# Patient Record
Sex: Male | Born: 1985 | Race: White | Hispanic: Yes | Marital: Single | Smoking: Current every day smoker
Health system: Southern US, Community
[De-identification: ages and names within clinical notes are randomized; demographics above are authoritative.]

## PROBLEM LIST (undated history)

## (undated) ENCOUNTER — Ambulatory Visit: Admission: EM | Discharge status: Absent without leave | Payer: Self-pay

---

## 2009-08-13 ENCOUNTER — Emergency Department: Payer: Self-pay | Admitting: Emergency Medicine

## 2014-04-03 ENCOUNTER — Emergency Department: Payer: Self-pay | Admitting: Emergency Medicine

## 2014-04-03 LAB — URINALYSIS, COMPLETE
Bacteria: NONE SEEN
Bilirubin,UR: NEGATIVE
Blood: NEGATIVE
Glucose,UR: NEGATIVE mg/dL (ref 0–75)
Ketone: NEGATIVE
Leukocyte Esterase: NEGATIVE
Nitrite: NEGATIVE
Ph: 6 (ref 4.5–8.0)
Protein: NEGATIVE
RBC,UR: 1 /HPF (ref 0–5)
Specific Gravity: 1.03 (ref 1.003–1.030)
Squamous Epithelial: <1
WBC UR: NONE SEEN /HPF (ref 0–5)

## 2014-04-03 LAB — CBC WITH DIFFERENTIAL/PLATELET
BASOS PCT: 0.6 %
Basophil #: 0 10*3/uL (ref 0.0–0.1)
Eosinophil #: 0.2 10*3/uL (ref 0.0–0.7)
Eosinophil %: 2.5 %
HCT: 46 % (ref 40.0–52.0)
HGB: 16 g/dL (ref 13.0–18.0)
LYMPHS PCT: 31.4 %
Lymphocyte #: 2.1 10*3/uL (ref 1.0–3.6)
MCH: 29.5 pg (ref 26.0–34.0)
MCHC: 34.7 g/dL (ref 32.0–36.0)
MCV: 85 fL (ref 80–100)
Monocyte #: 0.4 x10 3/mm (ref 0.2–1.0)
Monocyte %: 5.9 %
Neutrophil #: 4 10*3/uL (ref 1.4–6.5)
Neutrophil %: 59.6 %
Platelet: 212 10*3/uL (ref 150–440)
RBC: 5.43 10*6/uL (ref 4.40–5.90)
RDW: 13 % (ref 11.5–14.5)
WBC: 6.7 10*3/uL (ref 3.8–10.6)

## 2014-04-03 LAB — LIPASE, BLOOD: LIPASE: 128 U/L (ref 73–393)

## 2014-04-04 LAB — COMPREHENSIVE METABOLIC PANEL
ALBUMIN: 3.7 g/dL (ref 3.4–5.0)
ALK PHOS: 116 U/L
AST: 47 U/L — AB (ref 15–37)
Anion Gap: 12 (ref 7–16)
BILIRUBIN TOTAL: 0.4 mg/dL (ref 0.2–1.0)
BUN: 19 mg/dL — ABNORMAL HIGH (ref 7–18)
CHLORIDE: 102 mmol/L (ref 98–107)
CO2: 23 mmol/L (ref 21–32)
Calcium, Total: 7.9 mg/dL — ABNORMAL LOW (ref 8.5–10.1)
Creatinine: 0.71 mg/dL (ref 0.60–1.30)
GLUCOSE: 164 mg/dL — AB (ref 65–99)
Osmolality: 280 (ref 275–301)
Potassium: 4 mmol/L (ref 3.5–5.1)
SGPT (ALT): 73 U/L — ABNORMAL HIGH
Sodium: 137 mmol/L (ref 136–145)
TOTAL PROTEIN: 6.8 g/dL (ref 6.4–8.2)

## 2015-01-29 ENCOUNTER — Emergency Department: Payer: Self-pay

## 2015-01-29 ENCOUNTER — Encounter: Payer: Self-pay | Admitting: Emergency Medicine

## 2015-01-29 ENCOUNTER — Emergency Department
Admission: EM | Admit: 2015-01-29 | Discharge: 2015-01-29 | Disposition: A | Discharge status: Home / Self Care | Payer: Self-pay | Attending: Emergency Medicine | Admitting: Emergency Medicine

## 2015-01-29 DIAGNOSIS — L03114 Cellulitis of left upper limb: Secondary | ICD-10-CM | POA: Insufficient documentation

## 2015-01-29 DIAGNOSIS — Y9389 Activity, other specified: Secondary | ICD-10-CM | POA: Insufficient documentation

## 2015-01-29 DIAGNOSIS — Z720 Tobacco use: Secondary | ICD-10-CM | POA: Insufficient documentation

## 2015-01-29 DIAGNOSIS — Y9289 Other specified places as the place of occurrence of the external cause: Secondary | ICD-10-CM | POA: Insufficient documentation

## 2015-01-29 DIAGNOSIS — S61412A Laceration without foreign body of left hand, initial encounter: Secondary | ICD-10-CM | POA: Insufficient documentation

## 2015-01-29 DIAGNOSIS — Y998 Other external cause status: Secondary | ICD-10-CM | POA: Insufficient documentation

## 2015-01-29 DIAGNOSIS — Y288XXA Contact with other sharp object, undetermined intent, initial encounter: Secondary | ICD-10-CM | POA: Insufficient documentation

## 2015-01-29 DIAGNOSIS — S61432A Puncture wound without foreign body of left hand, initial encounter: Secondary | ICD-10-CM

## 2015-01-29 DIAGNOSIS — Z23 Encounter for immunization: Secondary | ICD-10-CM | POA: Insufficient documentation

## 2015-01-29 MED ORDER — OXYCODONE-ACETAMINOPHEN 5-325 MG PO TABS: 1.0000 | ORAL_TABLET | Freq: Four times a day (QID) | ORAL | Status: AC | PRN

## 2015-01-29 MED ORDER — NAPROXEN 500 MG PO TABS
500.0000 mg | ORAL_TABLET | Freq: Once | ORAL | Status: AC
  Administered 2015-01-29: 500 mg via ORAL
  Filled 2015-01-29: qty 1

## 2015-01-29 MED ORDER — TETANUS-DIPHTH-ACELL PERTUSSIS 5-2.5-18.5 LF-MCG/0.5 IM SUSP
0.5000 mL | Freq: Once | INTRAMUSCULAR | Status: AC
  Administered 2015-01-29: 0.5 mL via INTRAMUSCULAR

## 2015-01-29 MED ORDER — TETANUS-DIPHTH-ACELL PERTUSSIS 5-2.5-18.5 LF-MCG/0.5 IM SUSP
INTRAMUSCULAR | Status: AC
  Administered 2015-01-29: 0.5 mL via INTRAMUSCULAR
  Filled 2015-01-29: qty 0.5

## 2015-01-29 MED ORDER — LIDOCAINE HCL (PF) 1 % IJ SOLN
5.0000 mL | Freq: Once | INTRAMUSCULAR | Status: AC
  Administered 2015-01-29: 5 mL via INTRADERMAL
  Filled 2015-01-29: qty 5

## 2015-01-29 MED ORDER — SULFAMETHOXAZOLE-TRIMETHOPRIM 800-160 MG PO TABS: 1.0000 | ORAL_TABLET | Freq: Two times a day (BID) | ORAL | Status: AC

## 2015-01-29 MED ORDER — OXYCODONE HCL 5 MG PO TABS
10.0000 mg | ORAL_TABLET | Freq: Once | ORAL | Status: AC
  Administered 2015-01-29: 10 mg via ORAL
  Filled 2015-01-29: qty 2

## 2015-01-29 MED ORDER — CEPHALEXIN 500 MG PO CAPS: 500.0000 mg | ORAL_CAPSULE | Freq: Two times a day (BID) | ORAL | Status: AC

## 2015-01-29 NOTE — ED Provider Notes (Signed)
[facility] Emergency Department Correy Weidner Note ____________________________________________  Time seen: Approximately 5:53 PM  I have reviewed the triage vital signs and the nursing notes.   HISTORY  Chief Complaint Cellulitis   HPI Isaac Bryan is a 29 y.o. male who presents to the emergency department for swelling to the left hand. He states that there is a possibility he has wood or metal in the hand.The hand is becoming very painful.   History reviewed. No pertinent past medical history.  There are no active problems to display for this patient.   History reviewed. No pertinent past surgical history.  Current Outpatient Rx  Name  Route  Sig  Dispense  Refill  . cephALEXin (KEFLEX) 500 MG capsule   Oral   Take 1 capsule (500 mg total) by mouth 2 (two) times daily.   40 capsule   0   . oxyCODONE-acetaminophen (ROXICET) 5-325 MG tablet   Oral   Take 1 tablet by mouth every 6 (six) hours as needed.   9 tablet   0   . sulfamethoxazole-trimethoprim (BACTRIM DS,SEPTRA DS) 800-160 MG tablet   Oral   Take 1 tablet by mouth 2 (two) times daily.   20 tablet   0     Allergies Review of patient's allergies indicates no known allergies.  No family history on file.  Social History Social History  Substance Use Topics  . Smoking status: Current Every Day Smoker    Types: Cigarettes  . Smokeless tobacco: None  . Alcohol Use: Yes     Comment: occassional    Review of Systems Constitutional: No recent illness. Eyes: No visual changes. ENT: No sore throat. Cardiovascular: Denies chest pain or palpitations. Respiratory: Denies shortness of breath. Gastrointestinal: No abdominal pain.  Genitourinary: Negative for dysuria. Musculoskeletal: Pain in left hand. Skin: Negative for rash. Neurological: Negative for headaches, focal weakness or numbness. 10-point ROS otherwise  negative.  ____________________________________________   PHYSICAL EXAM:  VITAL SIGNS: ED Triage Vitals  Enc Vitals Group     BP 01/29/15 1700 145/98 mmHg     Pulse Rate 01/29/15 1700 80     Resp 01/29/15 1700 16     Temp 01/29/15 1700 98.7 F (37.1 C)     Temp Source 01/29/15 1700 Oral     SpO2 01/29/15 1700 98 %     Weight 01/29/15 1700 180 lb (81.647 kg)     Height 01/29/15 1700  (1.6 m)     Head Cir --      Peak Flow --      Pain Score 01/29/15 1700 10     Pain Loc --      Pain Edu? --      Excl. in GC? --     Constitutional: Alert and oriented. Well appearing and in no acute distress. Eyes: Conjunctivae are normal. EOMI. Head: Atraumatic. Nose: No congestion/rhinnorhea. Neck: No stridor.  Respiratory: Normal respiratory effort.   Musculoskeletal: Edema and mild erythema noted to the left hand. Questionable puncture site noted in the web space of the thumb.  Neurologic:  Normal speech and language. No gross focal neurologic deficits are appreciated. Speech is normal. No gait instability. Skin:  Skin is warm, dry and intact. Atraumatic. Psychiatric: Mood and affect are normal. Speech and behavior are normal.  ____________________________________________   LABS (all labs ordered are listed, but only abnormal results are displayed)  Labs Reviewed - No data to display ____________________________________________  RADIOLOGY  No evidence of foreign  body.  I, Kem Boroughs, personally viewed and evaluated these images (plain radiographs) as part of my medical decision making.    Bedside ultrasound shows a small pocket of infection. No foreign body identified.   ____________________________________________   PROCEDURES  Procedure(s) performed:   INCISION AND DRAINAGE Performed by: Kem Boroughs Consent: Verbal consent obtained. Risks and benefits: risks, benefits and alternatives were discussed Type: abscess  Body area: left hand  Anesthesia:  local infiltration  Incision was made with a scalpel.  Local anesthetic: lidocaine 1% with epinephrine  Anesthetic total: 4 ml  Complexity: complex Blunt dissection to break up loculations  Drainage: purulent/blood  Drainage amount: small  Packing material: none  Patient tolerance: Patient tolerated the procedure well with no immediate complications.    __________________________________________   INITIAL IMPRESSION / ASSESSMENT AND PLAN / ED COURSE  Pertinent labs & imaging results that were available during my care of the patient were reviewed by me and considered in my medical decision making (see chart for details).  Patient was advised to return to the emergency department 2 days for a wound check. He was advised to return sooner if he believes that his symptoms are worsening. He was advised to take both of the antibiotics as prescribed until finished. He was advised to use pain medication as prescribed and as needed, but not to drive or operate heavy machinery if using pain medicine. ____________________________________________   FINAL CLINICAL IMPRESSION(S) / ED DIAGNOSES  Final diagnoses:  Cellulitis of hand, left  Puncture wound of hand, left, initial encounter      Chinita Pester, FNP 01/29/15 1944  Phineas Semen, MD 01/29/15 2057

## 2015-01-29 NOTE — ED Notes (Signed)
Per interpreter, Pt reports swelling to left hand 6 days ago, reports it resolved on it's own. Pt reports swelling started again last night to left hand. Pt report he thinks there is something in the palm of his left hand, possibly wood or metal.

## 2015-09-19 ENCOUNTER — Emergency Department: Payer: Self-pay

## 2015-09-19 ENCOUNTER — Emergency Department
Admission: EM | Admit: 2015-09-19 | Discharge: 2015-09-19 | Disposition: A | Discharge status: Home / Self Care | Payer: Self-pay | Attending: Emergency Medicine | Admitting: Emergency Medicine

## 2015-09-19 DIAGNOSIS — M546 Pain in thoracic spine: Secondary | ICD-10-CM | POA: Insufficient documentation

## 2015-09-19 DIAGNOSIS — M795 Residual foreign body in soft tissue: Secondary | ICD-10-CM | POA: Insufficient documentation

## 2015-09-19 DIAGNOSIS — F17210 Nicotine dependence, cigarettes, uncomplicated: Secondary | ICD-10-CM | POA: Insufficient documentation

## 2015-09-19 LAB — URINALYSIS COMPLETE WITH MICROSCOPIC (ARMC ONLY)
BACTERIA UA: NONE SEEN
Bilirubin Urine: NEGATIVE
Glucose, UA: NEGATIVE mg/dL
Hgb urine dipstick: NEGATIVE
Ketones, ur: NEGATIVE mg/dL
Leukocytes, UA: NEGATIVE
Nitrite: NEGATIVE
PH: 5 (ref 5.0–8.0)
PROTEIN: NEGATIVE mg/dL
SQUAMOUS EPITHELIAL / LPF: NONE SEEN
Specific Gravity, Urine: 1.027 (ref 1.005–1.030)

## 2015-09-19 MED ORDER — ACYCLOVIR 800 MG PO TABS: 800.0000 mg | ORAL_TABLET | Freq: Every day | ORAL | Status: AC

## 2015-09-19 MED ORDER — LIDOCAINE HCL (PF) 1 % IJ SOLN
5.0000 mL | Freq: Once | INTRAMUSCULAR | Status: AC
  Administered 2015-09-19: 5 mL
  Filled 2015-09-19: qty 5

## 2015-09-19 MED ORDER — PREDNISONE 10 MG (21) PO TBPK: ORAL_TABLET | ORAL | Status: AC

## 2015-09-19 MED ORDER — ACYCLOVIR 200 MG PO CAPS
800.0000 mg | ORAL_CAPSULE | Freq: Once | ORAL | Status: AC
  Administered 2015-09-19: 800 mg via ORAL
  Filled 2015-09-19: qty 4

## 2015-09-19 NOTE — Discharge Instructions (Signed)
Dolor de espalda en adultos (Back Pain, Adult) El dolor de espalda es muy frecuente. A menudo mejora con el tiempo. La causa del dolor de espalda generalmente no es peligrosa. La Harley-Davidsonmayora de las personas puede aprender a Runner, broadcasting/film/videomanejar el dolor de espalda por s mismas.  CUIDADOS EN EL HOGAR  Controle su dolor de espalda a fin de Public house managerdetectar algn cambio. Las siguientes indicaciones ayudarn a Psychologist, clinicalaliviar cualquier dolor que pueda sentir:  Materials engineerMantngase activo. Comience con caminatas cortas sobre superficies planas si es posible. Trate de caminar un poco ms cada da.  Haga ejercicios con regularidad tal como le indic el mdico. El ejercicio ayuda a que su espalda se cure ms rpidamente. Tambin ayuda a prevenir futuras lesiones al Kimberly-Clarkmantener los msculos fuertes y flexibles.  No se siente, conduzca ni permanezca de pie durante ms de 30 minutos.  No permanezca en la cama. Si hace reposo ms de 1 a 2 das, puede demorar su recuperacin.  Sea cuidadoso al inclinarse o levantar un objeto. Use una tcnica apropiada para levantar peso:  Flexione las rodillas.  Mantenga el objeto cerca del cuerpo.  No gire.  Duerma sobre un NVR Inccolchn firme. Recustese sobre un costado y flexione las rodillas. Si se recuesta Fisher Scientificsobre la espalda, coloque una almohada debajo de las rodillas.  Tome los medicamentos solamente como se lo haya indicado el mdico.  Aplique hielo sobre la zona lesionada.  Ponga el hielo en una bolsa plstica.  Coloque una toalla entre la piel y la bolsa de hielo.  Deje el hielo durante 20minutos, 2 a 3veces por da, durante los primeros 2 o 3das. Despus de eso, puede alternar entre compresas de hielo y Airline pilotcalor.  Evite sentir ansiedad o estrs. Encuentre maneras efectivas de lidiar con el estrs, Surveyor, miningcomo hacer ejercicio.  Mantenga un peso saludable. El peso excesivo ejerce tensin sobre la espalda. SOLICITE AYUDA SI:   Siente dolor que no se alivia con reposo o medicamentos.  Siente cada vez ms  dolor que se extiende a las piernas o los glteos.  El dolor no mejora en una semana.  Siente dolor por la noche.  Pierde peso.  Siente escalofros o fiebre. SOLICITE AYUDA DE INMEDIATO SI:   No puede controlar su materia fecal (heces) o el pis (orina).  Siente debilidad en las piernas o los brazos.  Siente prdida de la sensibilidad (adormecimiento) en las piernas o los brazos.  Tiene malestar estomacal (nuseas) o vomita.  Siente dolor de estmago (abdominal).  Siente que se desvanece (se desmaya).   Esta informacin no tiene Theme park managercomo fin reemplazar el consejo del mdico. Asegrese de hacerle al mdico cualquier pregunta que tenga.   Document Released: 11/02/2010 Document Revised: 05/10/2014 Elsevier Interactive Patient Education [employer]2016 Elsevier Inc.   I am concerned you've maybe developing shingles on your back and chest. Watch for the appearance of a rash. Take medicine as directed. Return to the emergency room for any worsening symptoms. - Me preocupa que quiza tenga "shingles" o herpez en la espalda y pecho. Vigile el aumento de granitos. Tome medicina como se indica. Regrese a Urgencias si los sintomas empeoran.

## 2015-09-19 NOTE — ED Notes (Signed)
[facility] pain to his right upper side and onto his right back below his shoulder blade area. Pt denies injury but has an area that looks bruised. Pt reports the area feels burning and like needles is going through his skin. Area does not lok like a shingle rash but pt describes symptoms similar to shingles. Pt reports pain worse when touched.

## 2015-09-19 NOTE — ED Provider Notes (Signed)
[facility] Emergency Department Provider Note  ____________________________________________  Time seen: Approximately 9:32 PM  I have reviewed the triage vital signs and the nursing notes.   HISTORY  Chief Complaint Flank Pain    HPI Isaac Bryan is a 30 y.o. male who presents with 2 days of right-sided trunk pain. He describes a burning sensation. No rash. No fever, nausea, abdominal pain, constipation.Also had a splinter in his left hand that he removed with fingernail clippers. Finger is mildly sore at the base of the fifth digit. He also has a nodule on his right hand just below the first MCP joint. He believes there is a splinter in the nodule, present for 2 months.   No past medical history on file.  There are no active problems to display for this patient.   No past surgical history on file.  Current Outpatient Rx  Name  Route  Sig  Dispense  Refill  . acyclovir (ZOVIRAX) 800 MG tablet   Oral   Take 1 tablet (800 mg total) by mouth 5 (five) times daily.   35 tablet   0   . oxyCODONE-acetaminophen (ROXICET) 5-325 MG tablet   Oral   Take 1 tablet by mouth every 6 (six) hours as needed.   9 tablet   0   . predniSONE (STERAPRED UNI-PAK 21 TAB) 10 MG (21) TBPK tablet      6 tablets on day 1, 5 tablets on day 2, 4 tablets on day 3, etc...   21 tablet   0   . sulfamethoxazole-trimethoprim (BACTRIM DS,SEPTRA DS) 800-160 MG tablet   Oral   Take 1 tablet by mouth 2 (two) times daily.   20 tablet   0     Allergies Review of patient's allergies indicates no known allergies.  No family history on file.  Social History Social History  Substance Use Topics  . Smoking status: Current Every Day Smoker    Types: Cigarettes  . Smokeless tobacco: Not on file  . Alcohol Use: Yes     Comment: occassional    Review of Systems Constitutional: No fever/chills Eyes: No visual changes. ENT: No sore throat. Cardiovascular: Denies  chest pain. Respiratory: Denies shortness of breath. Gastrointestinal: No abdominal pain.  No nausea, no vomiting.  No diarrhea.  No constipation. Genitourinary: Negative for dysuria. Musculoskeletal: Negative for back pain. Skin: Neurological: Negative for headaches, focal weakness or numbness. 10-point ROS otherwise negative.  ____________________________________________   PHYSICAL EXAM:  VITAL SIGNS: ED Triage Vitals  Enc Vitals Group     BP 09/19/15 2108 151/75 mmHg     Pulse Rate 09/19/15 2108 73     Resp 09/19/15 2108 1     Temp 09/19/15 2108 98.4 F (36.9 C)     Temp Source 09/19/15 2108 Oral     SpO2 09/19/15 2108 98 %     Weight 09/19/15 2108 190 lb (86.183 kg)     Height 09/19/15 2108 5\' 3"  (1.6 m)     Head Cir --      Peak Flow --      Pain Score 09/19/15 2114 8     Pain Loc --      Pain Edu? --      Excl. in GC? --     Constitutional: Alert and oriented. Well appearing and in no acute distress. Eyes: Conjunctivae are normal.  EOMI. Ears:  Clear with normal landmarks. No erythema. Head: Atraumatic. Nose: No congestion/rhinnorhea. Mouth/Throat: Mucous membranes are  moist.   Neck:  Supple.  No adenopathy.   Cardiovascular: Normal rate, regular rhythm. Grossly normal heart sounds.  Good peripheral circulation. Respiratory: Normal respiratory effort.  No retractions. Lungs CTAB. Gastrointestinal: Soft and nontender. No distention. No abdominal bruits. No CVA tenderness. Musculoskeletal: Nml ROM of upper and lower extremity joints. Neurologic:  Normal speech and language. No gross focal neurologic deficits are appreciated. No gait instability. Skin:   1/2cm x 2mm open wound to left hand, base of 5th digit.  No FB noted. Nodule with central dark spot to right hand just below 1st mcp joint. No vesicular lesions on right lateral trunk, but has a faint macular nonblanching rash localized to the right mid back, almost in a circular appearance.  Psychiatric: Mood and  affect are normal. Speech and behavior are normal.  ____________________________________________   LABS (all labs ordered are listed, but only abnormal results are displayed)  Labs Reviewed  URINALYSIS COMPLETEWITH MICROSCOPIC ([facility] ONLY) - Abnormal; Notable for the following:    Color, Urine YELLOW (*)    APPearance CLEAR (*)    All other components within normal limits   ____________________________________________  EKG    ____________________________________________  RADIOLOGY  CLINICAL DATA: 30 year old male with right-sided chest pain  EXAM: CHEST 2 VIEW  COMPARISON: None.  FINDINGS: The heart size and mediastinal contours are within normal limits. Both lungs are clear. The visualized skeletal structures are unremarkable.  IMPRESSION: No active cardiopulmonary disease.   Electronically Signed  By: Elgie Collard M.D.  On: 09/19/2015 21:44   ____________________________________________   PROCEDURES  Procedure(s) performed: Used lidocaine for anesthesia to nodule on right hand, base of the thumb. Searched For foreign body, without success. Patient tolerated well and the palpitations. On small amount of bleeding.  Critical Care performed: No  ____________________________________________   INITIAL IMPRESSION / ASSESSMENT AND PLAN / ED COURSE  Pertinent labs & imaging results that were available during my care of the patient were reviewed by me and considered in my medical decision making (see chart for details).  30 year old with paresthesias, burning type pain in the right lateral trunk worrisome for zoster, though no rash present. Normal chest x-ray. Treated for shingles with acyclovir and prednisone taper. Given handouts. He can return if any new symptoms develop. Also attempted to remove a foreign body from his right hand. X-ray of the hand showed no obvious foreign body. He is encouraged to monitor for any  change. ____________________________________________   FINAL CLINICAL IMPRESSION(S) / ED DIAGNOSES  Final diagnoses:  Right-sided thoracic back pain  Foreign body (FB) in soft tissue      Ignacia Bayley, PA-C 09/19/15 2326  Myrna Blazer, MD 09/19/15 2342

## 2017-08-02 IMAGING — CR DG CHEST 2V
2 series · 2 of 2 positions shown · non-contrast
Comparison: None.

CLINICAL DATA: 29-year-old male with right-sided chest pain

EXAM:
CHEST  2 VIEW

[chest pa]
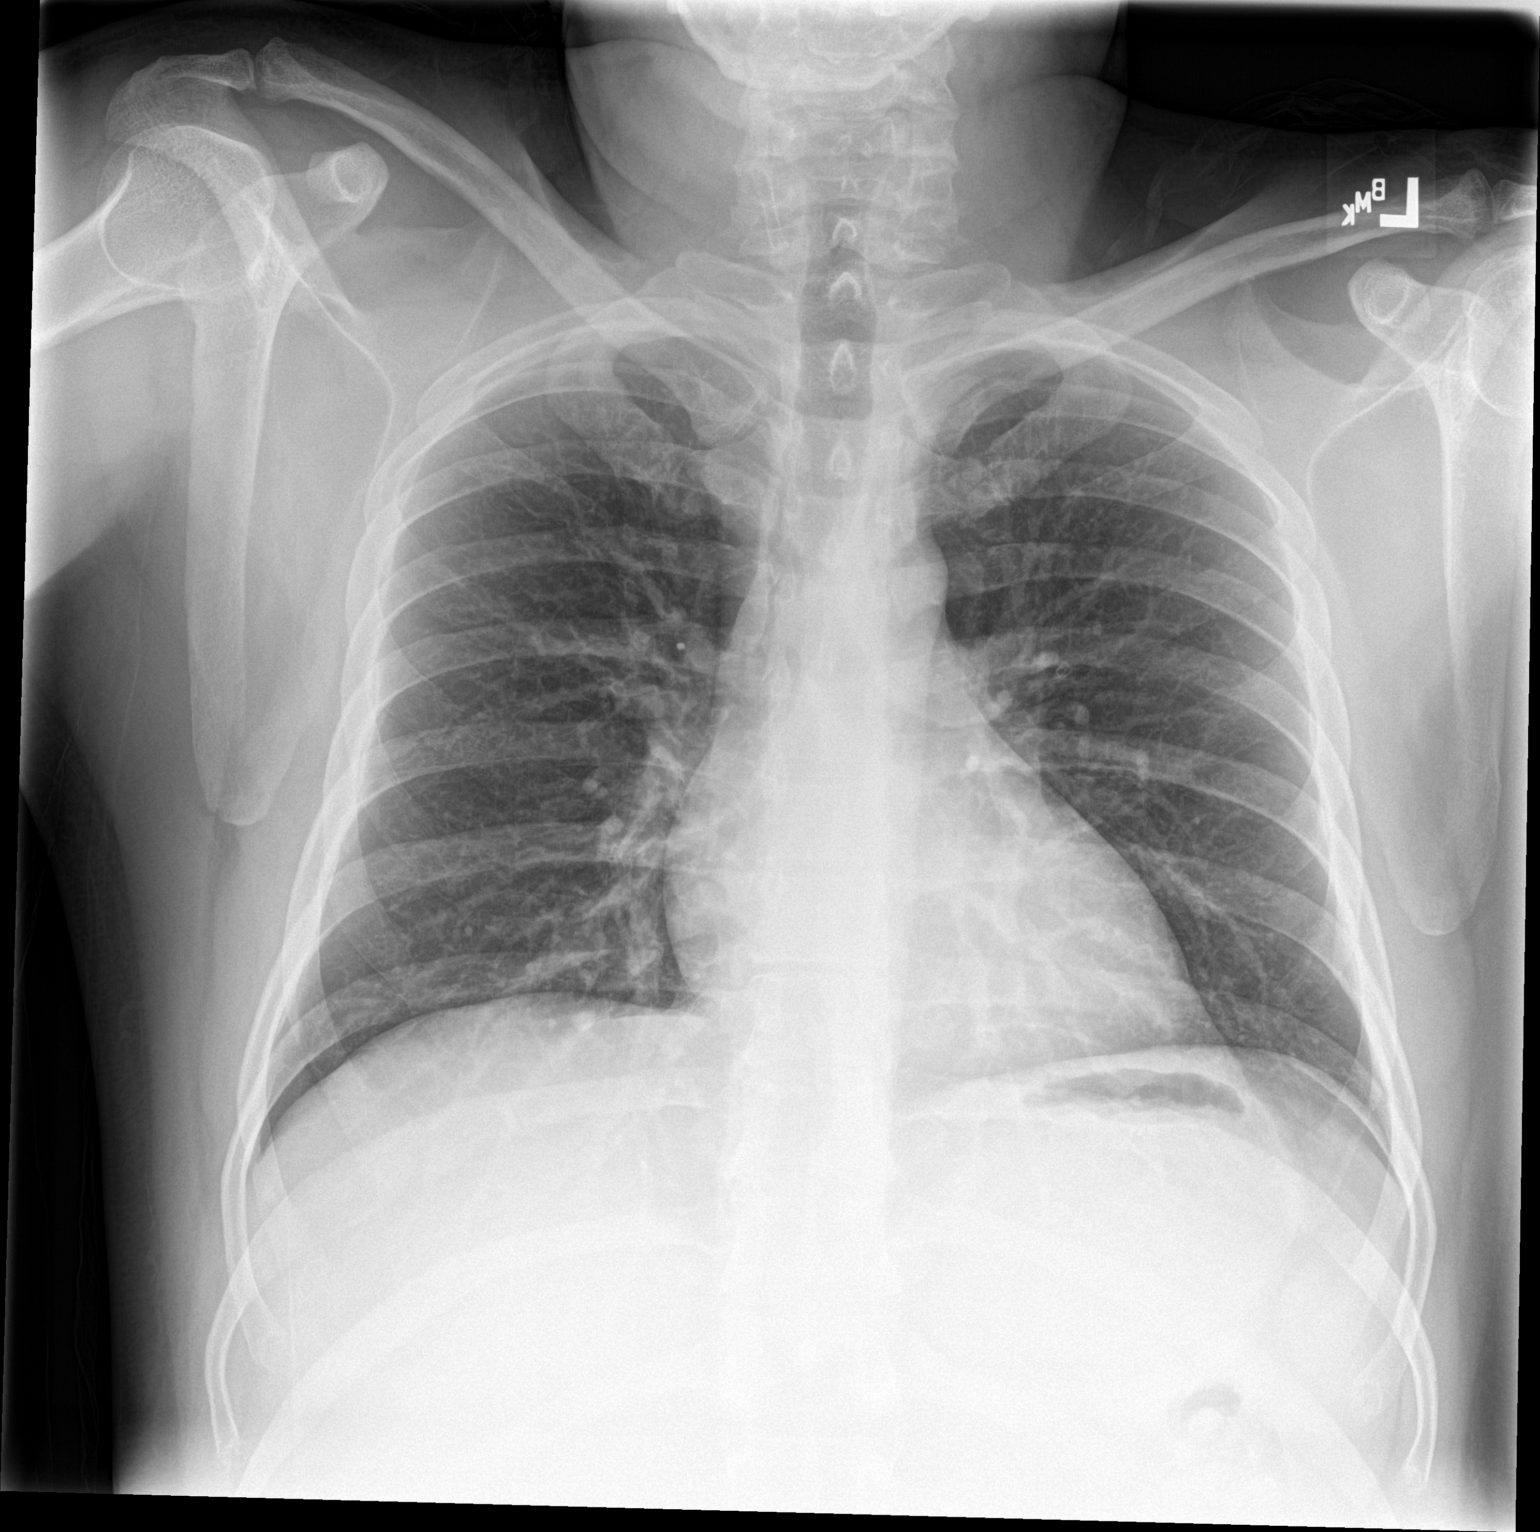

[chest lat]
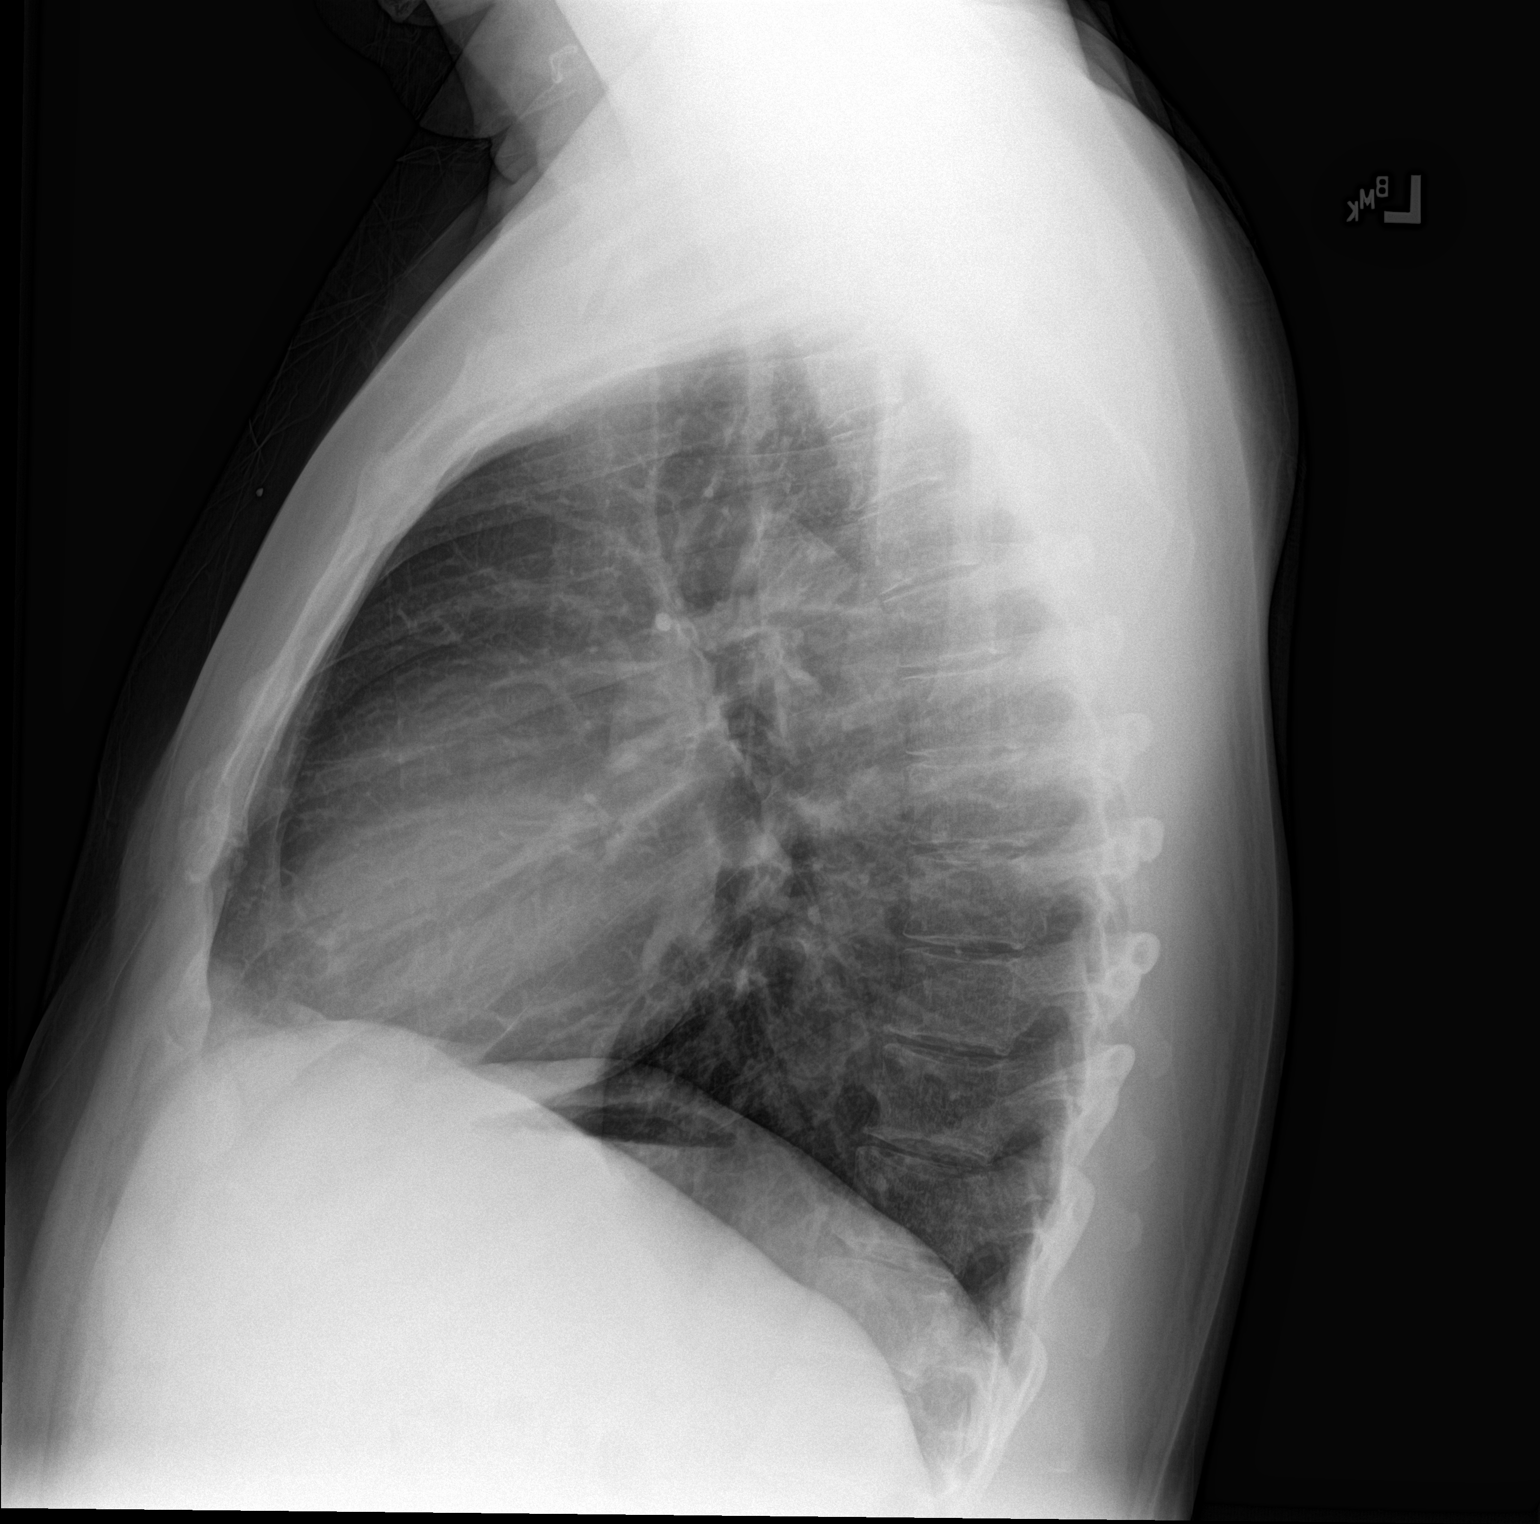

[2 of 2 positions shown; findings below may reference images not displayed]

FINDINGS: The heart size and mediastinal contours are within normal limits.
Both lungs are clear. The visualized skeletal structures are
unremarkable.
IMPRESSION: No active cardiopulmonary disease.

## 2020-12-18 ENCOUNTER — Emergency Department (HOSPITAL_COMMUNITY)
Admission: EM | Admit: 2020-12-18 | Discharge: 2020-12-18 | Disposition: A | Discharge status: Home / Self Care | Payer: Self-pay | Attending: Emergency Medicine | Admitting: Emergency Medicine

## 2020-12-18 ENCOUNTER — Other Ambulatory Visit: Payer: Self-pay

## 2020-12-18 ENCOUNTER — Encounter (HOSPITAL_COMMUNITY): Payer: Self-pay | Admitting: Emergency Medicine

## 2020-12-18 DIAGNOSIS — B86 Scabies: Secondary | ICD-10-CM | POA: Insufficient documentation

## 2020-12-18 DIAGNOSIS — F17210 Nicotine dependence, cigarettes, uncomplicated: Secondary | ICD-10-CM | POA: Insufficient documentation

## 2020-12-18 MED ORDER — PERMETHRIN 5 % EX CREA: TOPICAL_CREAM | CUTANEOUS | 2 refills | Status: AC

## 2020-12-18 NOTE — ED Provider Notes (Signed)
[facility]EM[facility]GENCY [facility]PT Provider Note   CSN: [id number] Arrival date & time: 12/18/20  2101     History Chief Complaint  Patient presents with   Rash    Lewin Pellow is a 35 y.o. male presenting with a complaint of a full body rash.  He states that last Wednesday he stated a hotel room and the next day he noticed bites over his body.  He says on the following Friday he left the hotel, but returned Monday and stated the hotel in a different room.  Each day he has become more itchy.  He reports feeling and seeing bugs moving under his skin.  He says that his worst in his hairline, forearms, groin.  He says that his friends in the same hotel have the same symptoms.    Rash Associated symptoms: no fever, no headaches, no myalgias, no nausea and not vomiting       History reviewed. No pertinent past medical history.  There are no problems to display for this patient.   History reviewed. No pertinent surgical history.     No family history on file.  Social History   Tobacco Use   Smoking status: Every Day    Packs/day: 1.00    Types: Cigarettes   Smokeless tobacco: Never  Substance Use Topics   Alcohol use: Yes    Alcohol/week: 21.0 standard drinks    Types: 21 Cans of beer per week   Drug use: Not Currently    Home Medications Prior to Admission medications   Medication Sig Start Date End Date Taking? Authorizing Provider  acyclovir (ZOVIRAX) 800 MG tablet Take 1 tablet (800 mg total) by mouth 5 (five) times daily. 09/19/15   Ignacia Bayley, PA-C  oxyCODONE-acetaminophen (ROXICET) 5-325 MG tablet Take 1 tablet by mouth every 6 (six) hours as needed. 01/29/15   Triplett, Cari B, FNP  predniSONE (STERAPRED UNI-PAK 21 TAB) 10 MG (21) TBPK tablet 6 tablets on day 1, 5 tablets on day 2, 4 tablets on day 3, etc... 09/19/15   Ignacia Bayley, PA-C  sulfamethoxazole-trimethoprim (BACTRIM DS,SEPTRA DS) 800-160 MG tablet Take 1 tablet by mouth 2 (two)  times daily. 01/29/15   Chinita Pester, FNP    Allergies    Patient has no known allergies.  Review of Systems   Review of Systems  Constitutional:  Negative for chills and fever.  Respiratory:  Negative for chest tightness.   Cardiovascular:  Negative for chest pain and palpitations.  Gastrointestinal:  Negative for nausea and vomiting.  Genitourinary:  Negative for dysuria and frequency.  Musculoskeletal:  Negative for myalgias.  Skin:  Positive for color change, rash and wound.  Neurological:  Negative for dizziness, weakness, light-headedness and headaches.  All other systems reviewed and are negative.  Physical Exam Updated Vital Signs BP (!) 157/96 (BP Location: Left Arm)   Pulse 69   Temp 98.5 F (36.9 C) (Oral)   Resp 18   SpO2 98%   Physical Exam Vitals and nursing note reviewed.  Constitutional:      Appearance: Normal appearance.  HENT:     Head: Normocephalic and atraumatic.  Eyes:     General: No scleral icterus.    Conjunctiva/sclera: Conjunctivae normal.  Cardiovascular:     Rate and Rhythm: Normal rate and regular rhythm.  Pulmonary:     Effort: Pulmonary effort is normal. No respiratory distress.  Musculoskeletal:     Cervical back: Normal range of motion.  Skin:  General: Skin is warm and dry.     Findings: Lesion and rash present.     Comments: Patient with multiple circular and erythematous lesions to his arms, trunk, scalp.  Some weeping due to expiration.  Scabs in multiple stages of healing.  Inflammation noted surrounding each lesion.  Neurological:     Mental Status: He is alert.  Psychiatric:        Mood and Affect: Mood normal.        Behavior: Behavior normal.    ED Results / Procedures / Treatments   Labs (all labs ordered are listed, but only abnormal results are displayed) Labs Reviewed - No data to display  EKG None  Radiology No results found.  Procedures Procedures   Medications Ordered in ED Medications - No  data to display  ED Course  I have reviewed the triage vital signs and the nursing notes.  Pertinent labs & imaging results that were available during my care of the patient were reviewed by me and considered in my medical decision making (see chart for details).    MDM Rules/Calculators/A&P                         Patient presented with a complaint of an itchy rash that began last week after staying in a hotel.  He stated that he could feel and see bugs burrowing in his skin.  He denied constitutional symptoms.  On my evaluation he had multiple lesions suspicious for scabies. I considered the possibility of a flea infestation, however history and PE more consistent with scabies.  I discussed the diagnosis and home treatment with the patient and his wife. Given information in spanish. Permethrin cream sent [facility].   Final Clinical Impression(s) / ED Diagnoses Final diagnoses:  Scabies    Rx / DC Orders Results and diagnoses were explained to the patient. Return precautions discussed in full. Patient had no additional questions and expressed complete understanding.     Saddie Benders, PA-C 12/18/20 2311    Sloan Leiter, DO 12/18/20 2328

## 2020-12-18 NOTE — Discharge Instructions (Addendum)
Ponga la crema en todo su cuerpo antes de acostarse. Djelo actuar durante 12 horas antes de lavarlo en la ducha. Repita este proceso en [employer].  Puede comprar cetirizina, fexofenadina o loratidina sin receta. Estos pueden ayudar con su picazn y no deben cansarlo. Tambin puede intentar usar benadryl. Deseche cualquier ropa o sbanas que pueda tener y sepa que esta es una infeccin contagiosa!

## 2020-12-18 NOTE — ED Triage Notes (Signed)
Patient stayed in a hotel last week while out of town for work. Patient states that on Thursday, a rash showed up. Patient states there were dogs in the hotel room prior to his stay. Patient complains of itching. Scabs noted.
